# Patient Record
Sex: Female | Born: 1960 | Race: White | Hispanic: No | Marital: Married | Smoking: Current every day smoker
Health system: Southern US, Community
[De-identification: ages and names within clinical notes are randomized; demographics above are authoritative.]

## PROBLEM LIST (undated history)

## (undated) DIAGNOSIS — K219 Gastro-esophageal reflux disease without esophagitis: Secondary | ICD-10-CM

## (undated) DIAGNOSIS — I10 Essential (primary) hypertension: Secondary | ICD-10-CM

## (undated) DIAGNOSIS — M1990 Unspecified osteoarthritis, unspecified site: Secondary | ICD-10-CM

## (undated) DIAGNOSIS — E785 Hyperlipidemia, unspecified: Secondary | ICD-10-CM

## (undated) DIAGNOSIS — IMO0001 Reserved for inherently not codable concepts without codable children: Secondary | ICD-10-CM

## (undated) HISTORY — PX: TUBAL LIGATION: SHX77

## (undated) HISTORY — PX: FRACTURE SURGERY: SHX138

---

## 2012-05-14 ENCOUNTER — Emergency Department (HOSPITAL_COMMUNITY): Payer: Self-pay

## 2012-05-14 ENCOUNTER — Encounter (HOSPITAL_COMMUNITY): Payer: Self-pay | Admitting: *Deleted

## 2012-05-14 ENCOUNTER — Emergency Department (HOSPITAL_COMMUNITY)
Admission: EM | Admit: 2012-05-14 | Discharge: 2012-05-14 | Disposition: A | Discharge status: Home / Self Care | Payer: Self-pay | Attending: Emergency Medicine | Admitting: Emergency Medicine

## 2012-05-14 DIAGNOSIS — T148XXA Other injury of unspecified body region, initial encounter: Secondary | ICD-10-CM

## 2012-05-14 DIAGNOSIS — X58XXXA Exposure to other specified factors, initial encounter: Secondary | ICD-10-CM | POA: Insufficient documentation

## 2012-05-14 DIAGNOSIS — I10 Essential (primary) hypertension: Secondary | ICD-10-CM | POA: Insufficient documentation

## 2012-05-14 DIAGNOSIS — Y93E9 Activity, other interior property and clothing maintenance: Secondary | ICD-10-CM | POA: Insufficient documentation

## 2012-05-14 DIAGNOSIS — Y92009 Unspecified place in unspecified non-institutional (private) residence as the place of occurrence of the external cause: Secondary | ICD-10-CM | POA: Insufficient documentation

## 2012-05-14 DIAGNOSIS — IMO0002 Reserved for concepts with insufficient information to code with codable children: Secondary | ICD-10-CM | POA: Insufficient documentation

## 2012-05-14 DIAGNOSIS — F17200 Nicotine dependence, unspecified, uncomplicated: Secondary | ICD-10-CM | POA: Insufficient documentation

## 2012-05-14 DIAGNOSIS — Y998 Other external cause status: Secondary | ICD-10-CM | POA: Insufficient documentation

## 2012-05-14 HISTORY — DX: Essential (primary) hypertension: I10

## 2012-05-14 MED ORDER — OXYCODONE-ACETAMINOPHEN 5-325 MG PO TABS: 1.0000 | ORAL_TABLET | ORAL | Status: AC | PRN

## 2012-05-14 MED ORDER — OXYCODONE-ACETAMINOPHEN 5-325 MG PO TABS
1.0000 | ORAL_TABLET | Freq: Once | ORAL | Status: AC
  Administered 2012-05-14: 1 via ORAL
  Filled 2012-05-14: qty 1

## 2012-05-14 NOTE — ED Notes (Signed)
Pt alert & oriented x4, stable gait. Patient given discharge instructions, paperwork & prescription(s). Patient  instructed to stop at the registration desk to finish any additional paperwork. Patient verbalized understanding. Pt left department w/ no further questions. 

## 2012-05-14 NOTE — ED Notes (Signed)
Pt states she took a flexeril last night. Has not taken anything today.

## 2012-05-14 NOTE — ED Notes (Signed)
Pt states has mid back pain with some chest pain x 2 days. Denies SOB, N/V and radiating pain. Pt states was cleaning house when pain started  "have have pulled something".

## 2012-05-17 NOTE — ED Provider Notes (Signed)
Medical screening examination/treatment/procedure(s) were performed by non-physician practitioner and as supervising physician I was immediately available for consultation/collaboration.   Glynn Octave, MD 05/17/12 1436

## 2012-05-17 NOTE — ED Provider Notes (Signed)
History     CSN: [id number]  Arrival date & time 05/14/12  1707   First MD Initiated Contact with Patient 05/14/12 1950      Chief Complaint  Patient presents with  . Back Pain  . Chest Pain    (Consider location/radiation/quality/duration/timing/severity/associated sxs/prior treatment) HPI Comments: Brittany Hudson presents with mid back pain which started suddenly while cleaning her house 2 days ago.  She describes left upper back pain which is sharp and radiates to her left axilla,  Is worse with deep inspiration,  Moving her left arm and with palpation along her left upper back and shoulder blade. She denies sob, nausea, vomiting, weakness, fever and cough.  She denies falls or other injury,  Simply was reaching forward when the pain began.  The history is provided by the patient and the spouse.    Past Medical History  Diagnosis Date  . Hypertension     Past Surgical History  Procedure Date  . Tubal ligation   . Fracture surgery     No family history on file.  History  Substance Use Topics  . Smoking status: Current Every Day Smoker -- 1.0 packs/day    Types: Cigarettes  . Smokeless tobacco: Not on file  . Alcohol Use: No    OB History    Grav Para Term Preterm Abortions TAB SAB Ect Mult Living                  Review of Systems  Constitutional: Negative for fever and chills.  Respiratory: Negative for cough and shortness of breath.   Cardiovascular: Negative for chest pain and leg swelling.  Gastrointestinal: Negative for abdominal pain, constipation and abdominal distention.  Genitourinary: Negative for dysuria, urgency, frequency, flank pain and difficulty urinating.  Musculoskeletal: Positive for back pain. Negative for joint swelling and gait problem.  Skin: Negative for rash.  Neurological: Negative for weakness and numbness.    Allergies  Aspirin  Home Medications   Current Outpatient Rx  Name Route Sig Dispense Refill  . ATENOLOL 25 MG PO  TABS Oral Take 25 mg by mouth every evening.     Marland Kitchen OMEGA-3 FATTY ACIDS 1000 MG PO CAPS Oral Take 1 g by mouth daily.     . IBUPROFEN 200 MG PO TABS Oral Take 800 mg by mouth daily as needed. For pain    . OXYCODONE-ACETAMINOPHEN 5-325 MG PO TABS Oral Take 1 tablet by mouth every 4 (four) hours as needed for pain. 20 tablet 0    BP 138/94  Pulse 74  Temp 98.6 F (37 C) (Oral)  Resp 20  Ht 5\' 7"  (1.702 m)  Wt 145 lb (65.772 kg)  BMI 22.71 kg/m2  SpO2 97%  Physical Exam  Nursing note and vitals reviewed. Constitutional: She appears well-developed and well-nourished.  HENT:  Head: Normocephalic.  Eyes: Conjunctivae normal are normal.  Neck: Normal range of motion. Neck supple.  Cardiovascular: Normal rate, regular rhythm and intact distal pulses.        Pedal pulses normal.  Pulmonary/Chest: Effort normal and breath sounds normal. No respiratory distress.  Abdominal: Soft. Bowel sounds are normal. She exhibits no distension and no mass.  Musculoskeletal: Normal range of motion. She exhibits no edema.       Lumbar back: She exhibits tenderness. She exhibits no swelling, no edema and no spasm.       Back:       TTP left upper back along lower edge of scapula to  left mid axillar line.  No thoracic spinous process ttp.  Equal grip strength.  Neurological: She is alert. She has normal strength. She displays no atrophy and no tremor. No sensory deficit. Gait normal.  Reflex Scores:      Patellar reflexes are 2+ on the right side and 2+ on the left side.      Achilles reflexes are 2+ on the right side and 2+ on the left side.      No strength deficit noted in hip and knee flexor and extensor muscle groups.  Ankle flexion and extension intact.  Skin: Skin is warm and dry.  Psychiatric: She has a normal mood and affect.    ED Course  Procedures (including critical care time)  Labs Reviewed - No data to display No results found.   1. Muscle strain       MDM  Findings  consistent with body wall/ muscle strain.  Pt prescribed oxycodone.  Encouraged ice/heat therapy.  She has flexeril at home, encouraged tid use x 5 days.   Patients labs and/or radiological studies were reviewed during the medical decision making and disposition process.         Burgess Amor, PA 05/17/12 1357

## 2013-08-13 IMAGING — CR DG CHEST 2V
2 series · 2 of 2 positions shown · non-contrast
Comparison: None.

CLINICAL DATA: Chest pain

CHEST - 2 VIEW

[view not recorded (1 of 2)]
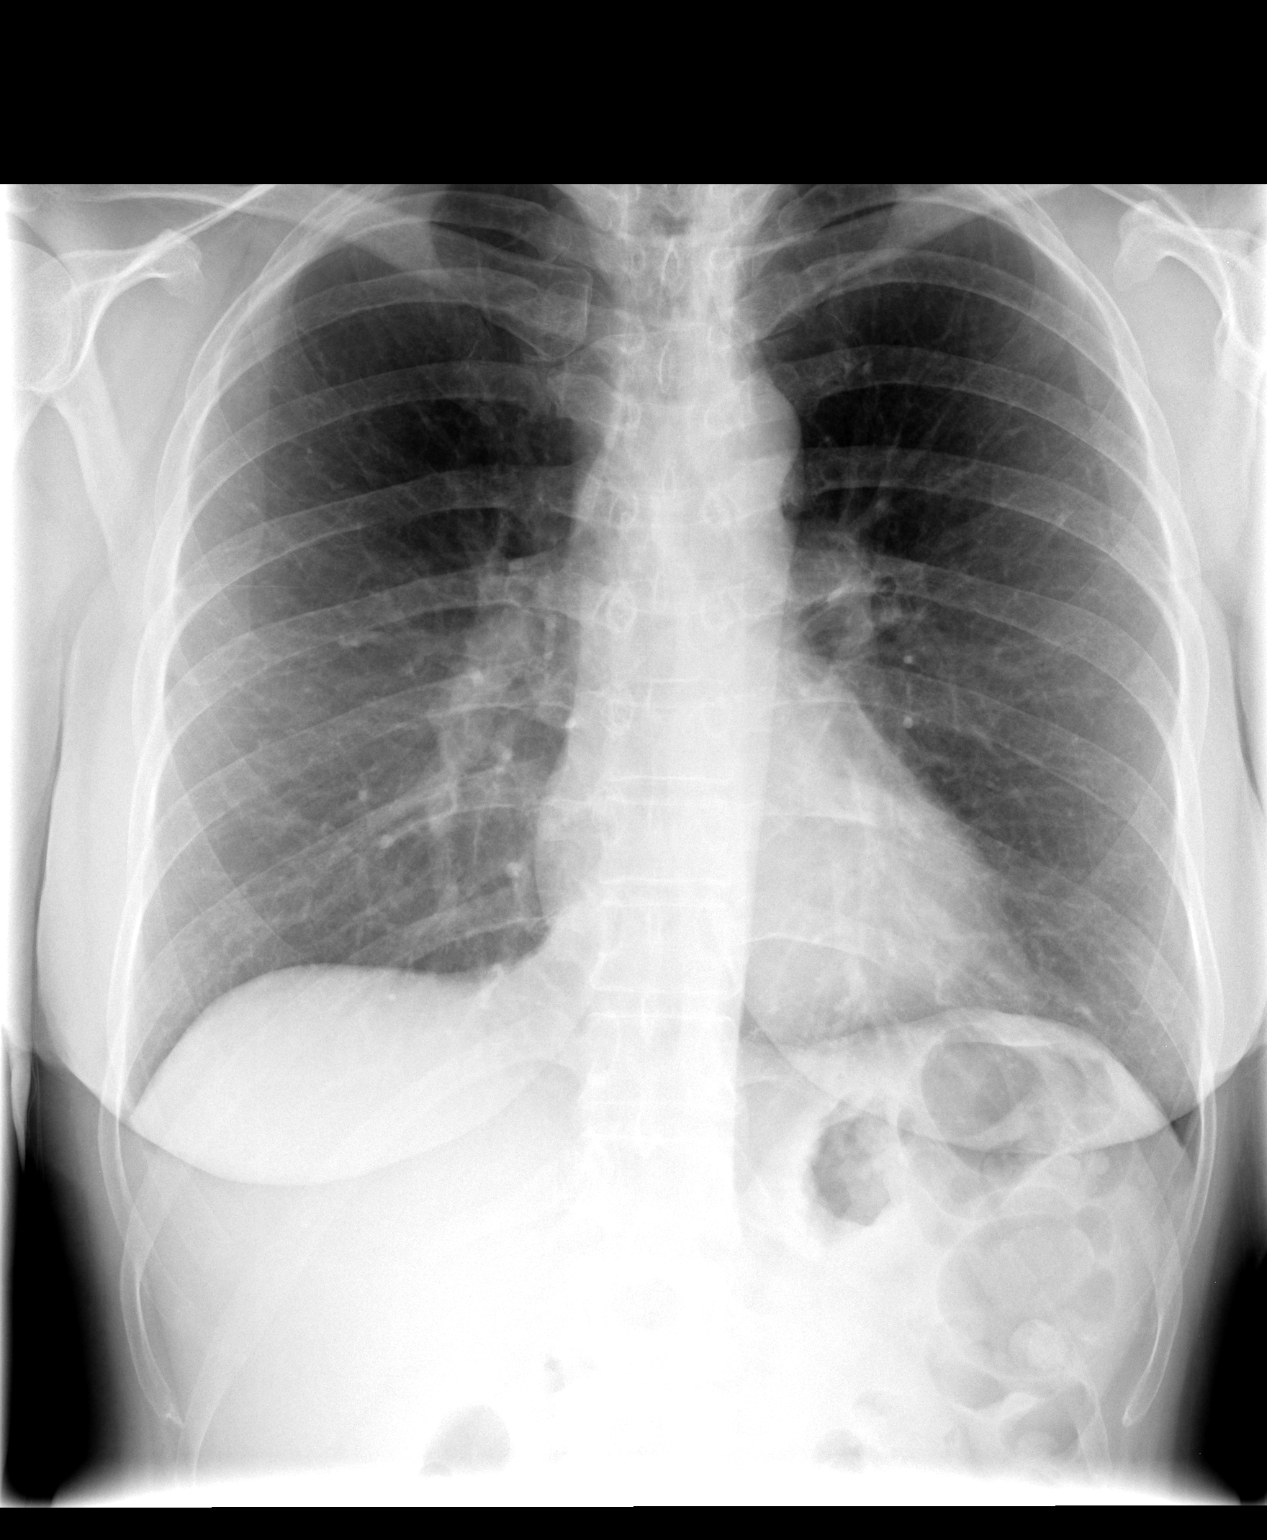

[view not recorded (2 of 2)]
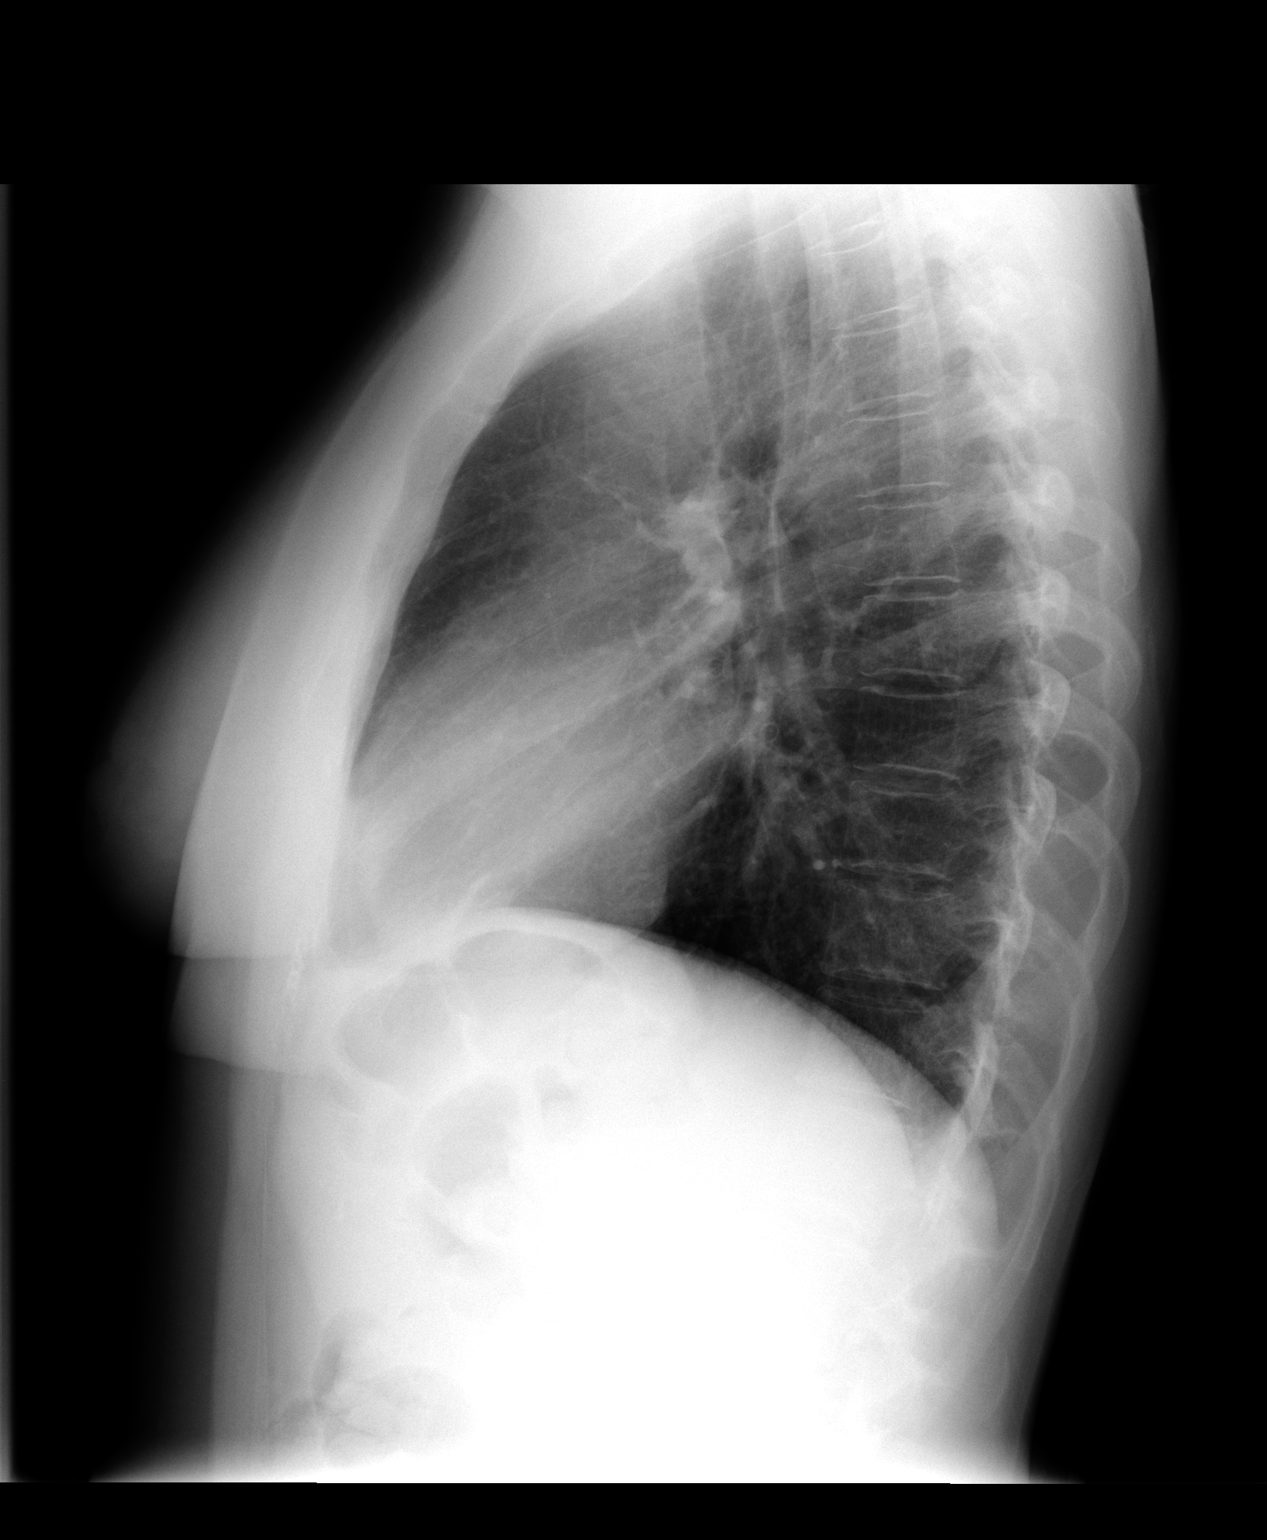

[2 of 2 positions shown; findings below may reference images not displayed]

FINDINGS: The lungs are clear without focal consolidation, edema,
effusion or pneumothorax.  Cardiopericardial silhouette is within
normal limits for size.  Imaged bony structures of the thorax are
intact.
IMPRESSION: Normal exam.

## 2015-10-04 ENCOUNTER — Emergency Department (HOSPITAL_COMMUNITY): Payer: Self-pay

## 2015-10-04 ENCOUNTER — Encounter (HOSPITAL_COMMUNITY): Payer: Self-pay | Admitting: *Deleted

## 2015-10-04 ENCOUNTER — Emergency Department (HOSPITAL_COMMUNITY)
Admission: EM | Admit: 2015-10-04 | Discharge: 2015-10-04 | Disposition: A | Discharge status: Home / Self Care | Payer: Self-pay | Attending: Emergency Medicine | Admitting: Emergency Medicine

## 2015-10-04 DIAGNOSIS — S29011A Strain of muscle and tendon of front wall of thorax, initial encounter: Secondary | ICD-10-CM

## 2015-10-04 DIAGNOSIS — W01198A Fall on same level from slipping, tripping and stumbling with subsequent striking against other object, initial encounter: Secondary | ICD-10-CM | POA: Insufficient documentation

## 2015-10-04 DIAGNOSIS — M1990 Unspecified osteoarthritis, unspecified site: Secondary | ICD-10-CM | POA: Insufficient documentation

## 2015-10-04 DIAGNOSIS — Z8719 Personal history of other diseases of the digestive system: Secondary | ICD-10-CM | POA: Insufficient documentation

## 2015-10-04 DIAGNOSIS — Y998 Other external cause status: Secondary | ICD-10-CM | POA: Insufficient documentation

## 2015-10-04 DIAGNOSIS — Y9289 Other specified places as the place of occurrence of the external cause: Secondary | ICD-10-CM | POA: Insufficient documentation

## 2015-10-04 DIAGNOSIS — E785 Hyperlipidemia, unspecified: Secondary | ICD-10-CM | POA: Insufficient documentation

## 2015-10-04 DIAGNOSIS — F17210 Nicotine dependence, cigarettes, uncomplicated: Secondary | ICD-10-CM | POA: Insufficient documentation

## 2015-10-04 DIAGNOSIS — Y9389 Activity, other specified: Secondary | ICD-10-CM | POA: Insufficient documentation

## 2015-10-04 DIAGNOSIS — Z760 Encounter for issue of repeat prescription: Secondary | ICD-10-CM

## 2015-10-04 DIAGNOSIS — R05 Cough: Secondary | ICD-10-CM | POA: Insufficient documentation

## 2015-10-04 DIAGNOSIS — I10 Essential (primary) hypertension: Secondary | ICD-10-CM

## 2015-10-04 HISTORY — DX: Gastro-esophageal reflux disease without esophagitis: K21.9

## 2015-10-04 HISTORY — DX: Unspecified osteoarthritis, unspecified site: M19.90

## 2015-10-04 HISTORY — DX: Reserved for inherently not codable concepts without codable children: IMO0001

## 2015-10-04 HISTORY — DX: Hyperlipidemia, unspecified: E78.5

## 2015-10-04 MED ORDER — ATENOLOL 25 MG PO TABS: 25.0000 mg | ORAL_TABLET | Freq: Every day | ORAL | Status: AC

## 2015-10-04 MED ORDER — TRAMADOL HCL 50 MG PO TABS: 50.0000 mg | ORAL_TABLET | Freq: Four times a day (QID) | ORAL | Status: AC | PRN

## 2015-10-04 NOTE — ED Notes (Signed)
Left rib pain x 2.5 weeks. Pt states unsure how she injured rib area. States she has been driving a vehicle without power steering and also fell in the woods 2.5 weeks ago. NAD.

## 2015-10-04 NOTE — Discharge Instructions (Signed)
Hypertension Hypertension, commonly called high blood pressure, is when the force of blood pumping through your arteries is too strong. Your arteries are the blood vessels that carry blood from your heart throughout your body. A blood pressure reading consists of a higher number over a lower number, such as 110/72. The higher number (systolic) is the pressure inside your arteries when your heart pumps. The lower number (diastolic) is the pressure inside your arteries when your heart relaxes. Ideally you want your blood pressure below 120/80. Hypertension forces your heart to work harder to pump blood. Your arteries may become narrow or stiff. Having untreated or uncontrolled hypertension can cause heart attack, stroke, kidney disease, and other problems. RISK FACTORS Some risk factors for high blood pressure are controllable. Others are not.  Risk factors you cannot control include:   Race. You may be at higher risk if you are African American.  Age. Risk increases with age.  Gender. Men are at higher risk than women before age 45 years. After age 65, women are at higher risk than men. Risk factors you can control include:  Not getting enough exercise or physical activity.  Being overweight.  Getting too much fat, sugar, calories, or salt in your diet.  Drinking too much alcohol. SIGNS AND SYMPTOMS Hypertension does not usually cause signs or symptoms. Extremely high blood pressure (hypertensive crisis) may cause headache, anxiety, shortness of breath, and nosebleed. DIAGNOSIS To check if you have hypertension, your health care provider will measure your blood pressure while you are seated, with your arm held at the level of your heart. It should be measured at least twice using the same arm. Certain conditions can cause a difference in blood pressure between your right and left arms. A blood pressure reading that is higher than normal on one occasion does not mean that you need treatment. If  it is not clear whether you have high blood pressure, you may be asked to return on a different day to have your blood pressure checked again. Or, you may be asked to monitor your blood pressure at home for 1 or more weeks. TREATMENT Treating high blood pressure includes making lifestyle changes and possibly taking medicine. Living a healthy lifestyle can help lower high blood pressure. You may need to change some of your habits. Lifestyle changes may include:  Following the DASH diet. This diet is high in fruits, vegetables, and whole grains. It is low in salt, red meat, and added sugars.  Keep your sodium intake below 2,300 mg per day.  Getting at least 30-45 minutes of aerobic exercise at least 4 times per week.  Losing weight if necessary.  Not smoking.  Limiting alcoholic beverages.  Learning ways to reduce stress. Your health care provider may prescribe medicine if lifestyle changes are not enough to get your blood pressure under control, and if one of the following is true:  You are 18-59 years of age and your systolic blood pressure is above 140.  You are 60 years of age or older, and your systolic blood pressure is above 150.  Your diastolic blood pressure is above 90.  You have diabetes, and your systolic blood pressure is over 140 or your diastolic blood pressure is over 90.  You have kidney disease and your blood pressure is above 140/90.  You have heart disease and your blood pressure is above 140/90. Your personal target blood pressure may vary depending on your medical conditions, your age, and other factors. HOME CARE INSTRUCTIONS    Have your blood pressure rechecked as directed by your health care provider.   Take medicines only as directed by your health care provider. Follow the directions carefully. Blood pressure medicines must be taken as prescribed. The medicine does not work as well when you skip doses. Skipping doses also puts you at risk for  problems.  Do not smoke.   Monitor your blood pressure at home as directed by your health care provider. SEEK MEDICAL CARE IF:   You think you are having a reaction to medicines taken.  You have recurrent headaches or feel dizzy.  You have swelling in your ankles.  You have trouble with your vision. SEEK IMMEDIATE MEDICAL CARE IF:  You develop a severe headache or confusion.  You have unusual weakness, numbness, or feel faint.  You have severe chest or abdominal pain.  You vomit repeatedly.  You have trouble breathing. MAKE SURE YOU:   Understand these instructions.  Will watch your condition.  Will get help right away if you are not doing well or get worse.   This information is not intended to replace advice given to you by your health care provider. Make sure you discuss any questions you have with your health care provider.   Document Released: 07/31/2005 Document Revised: 12/15/2014 Document Reviewed: 05/23/2013 Elsevier Interactive Patient Education 2016 [employer]  

## 2015-10-04 NOTE — ED Provider Notes (Signed)
CSN: [id number]     Arrival date & time 10/04/15  4098 History   First MD Initiated Contact with Patient 10/04/15 [phone]     Chief Complaint  Patient presents with  . Left rib pain      (Consider location/radiation/quality/duration/timing/severity/associated sxs/prior Treatment) The history is provided by the patient.   Brittany Hudson is a 55 y.o. female with history indicated below presenting with an approximate 2.5 week history of left rib pain.  She endorses tripping and falling (landed on her back) prior to onset of pain.  Her pain is also triggered by driving, stating she has been driving a car without power steering which make the pain worse.  She has noted swelling at the site of pain and is concerned about rib fracture. She denies fevers, chills, shortness of breath although deep inspiration makes pain worse.  She has had no medications prior to arrival.  Discussed elevated bp today, states she lost her insurance and pcp, has been out of her medications for awhile. She denies headache, vision changes, denies peripheral edema and leg pain. She is a smoker and reports a chronic dry cough.     Past Medical History  Diagnosis Date  . Hypertension   . Reflux   . Hyperlipidemia   . Arthritis    Past Surgical History  Procedure Laterality Date  . Tubal ligation    . Fracture surgery     No family history on file. Social History  Substance Use Topics  . Smoking status: Current Every Day Smoker -- 0.50 packs/day    Types: Cigarettes  . Smokeless tobacco: None  . Alcohol Use: No   OB History    No data available     Review of Systems  Constitutional: Negative for fever.  HENT: Negative for congestion and sore throat.   Eyes: Negative.   Respiratory: Positive for cough. Negative for chest tightness and shortness of breath.   Cardiovascular: Positive for chest pain.  Gastrointestinal: Negative for nausea, vomiting and abdominal pain.  Genitourinary: Negative.     Musculoskeletal: Negative for joint swelling, arthralgias and neck pain.  Skin: Negative.  Negative for rash and wound.  Neurological: Negative for dizziness, weakness, light-headedness, numbness and headaches.  Psychiatric/Behavioral: Negative.       Allergies  Aspirin  Home Medications   Prior to Admission medications   Medication Sig Start Date End Date Taking? Authorizing Provider  atenolol (TENORMIN) 25 MG tablet Take 1 tablet (25 mg total) by mouth daily. 10/04/15   Burgess Amor, PA-C  fish oil-omega-3 fatty acids 1000 MG capsule Take 1 g by mouth daily.     Historical Provider, MD  ibuprofen (ADVIL,MOTRIN) 200 MG tablet Take 800 mg by mouth daily as needed. For pain    Historical Provider, MD  traMADol (ULTRAM) 50 MG tablet Take 1 tablet (50 mg total) by mouth every 6 (six) hours as needed for moderate pain. 10/04/15   Burgess Amor, PA-C   BP 144/83 mmHg  Pulse 71  Temp(Src) 98.1 F (36.7 C) (Oral)  Resp 15  Ht  (1.702 m)  Wt 65.772 kg  BMI 22.71 kg/m2  SpO2 100% Physical Exam  Constitutional: She appears well-developed and well-nourished.  HENT:  Head: Normocephalic and atraumatic.  Eyes: Conjunctivae are normal.  Neck: Normal range of motion.  Cardiovascular: Normal rate, regular rhythm, normal heart sounds and intact distal pulses.   Pulmonary/Chest: Effort normal and breath sounds normal. She has no decreased breath sounds. She has no wheezes.  She has no rhonchi. She has no rales. She exhibits tenderness.    Reproducible tenderness left mid ribs at midclavicular line inferior to breast.  There is no deformity or crepitus.  Abdominal: Soft. Bowel sounds are normal. There is no tenderness.  Musculoskeletal: Normal range of motion.  Neurological: She is alert.  Skin: Skin is warm and dry.  Psychiatric: She has a normal mood and affect.  Nursing note and vitals reviewed.   ED Course  Procedures (including critical care time) Labs Review Labs Reviewed - No  data to display  Imaging Review Dg Ribs Unilateral W/chest Left  10/04/2015  CLINICAL DATA:  Left rib pain for the past 2.5 weeks. Fell at that time. EXAM: LEFT RIBS AND CHEST - 3+ VIEW COMPARISON:  05/14/2012 chest radiographs. FINDINGS: Normal sized heart. Interval small amount of linear density at the left lung base. Otherwise, clear lungs. No rib fracture or pneumothorax. IMPRESSION: 1. Interval minimal left basilar linear atelectasis or scarring. 2. No rib fracture seen. Electronically Signed   By: Beckie Salts M.D.   On: 10/04/2015 08:24   I have personally reviewed and evaluated these images and lab results as part of my medical decision-making.   EKG Interpretation   Date/Time:  Monday October 04 2015 08:24:35 EST Ventricular Rate:  70 PR Interval:  146 QRS Duration: 92 QT Interval:  427 QTC Calculation: 461 R Axis:   44 Text Interpretation:  Sinus rhythm No old tracing to compare Confirmed by  RANCOUR  MD, STEPHEN [phone]) on 10/04/2015 8:34:48 AM      MDM   Final diagnoses:  Strain of chest wall, initial encounter  Medication refill  Essential hypertension    Reproducible chest wall pain which is localized after a fall several weeks ago.  Vital signs are normal.  No exam findings suggesting DVT.  Doubt PE.  Suspect patient has been straining this site with her daily activities.  She was prescribed tramadol, also advised heat therapy.  She is given a prescription for her atenolol and advised to obtain primary care, referral given to try to general pediatric medicine.  When necessary follow-up anticipated.    Burgess Amor, PA-C 10/04/15 [id number]  Glynn Octave, MD 10/04/15 1435

## 2015-10-22 ENCOUNTER — Encounter (HOSPITAL_COMMUNITY): Payer: Self-pay | Admitting: Emergency Medicine

## 2015-10-22 ENCOUNTER — Emergency Department (HOSPITAL_COMMUNITY)
Admission: EM | Admit: 2015-10-22 | Discharge: 2015-10-22 | Disposition: A | Discharge status: Home / Self Care | Payer: Self-pay | Attending: Emergency Medicine | Admitting: Emergency Medicine

## 2015-10-22 ENCOUNTER — Emergency Department (HOSPITAL_COMMUNITY): Payer: Self-pay

## 2015-10-22 DIAGNOSIS — I10 Essential (primary) hypertension: Secondary | ICD-10-CM | POA: Insufficient documentation

## 2015-10-22 DIAGNOSIS — F17210 Nicotine dependence, cigarettes, uncomplicated: Secondary | ICD-10-CM | POA: Insufficient documentation

## 2015-10-22 DIAGNOSIS — Y9289 Other specified places as the place of occurrence of the external cause: Secondary | ICD-10-CM | POA: Insufficient documentation

## 2015-10-22 DIAGNOSIS — Z79899 Other long term (current) drug therapy: Secondary | ICD-10-CM | POA: Insufficient documentation

## 2015-10-22 DIAGNOSIS — Y998 Other external cause status: Secondary | ICD-10-CM | POA: Insufficient documentation

## 2015-10-22 DIAGNOSIS — E785 Hyperlipidemia, unspecified: Secondary | ICD-10-CM | POA: Insufficient documentation

## 2015-10-22 DIAGNOSIS — Z8719 Personal history of other diseases of the digestive system: Secondary | ICD-10-CM | POA: Insufficient documentation

## 2015-10-22 DIAGNOSIS — R0789 Other chest pain: Secondary | ICD-10-CM

## 2015-10-22 DIAGNOSIS — R05 Cough: Secondary | ICD-10-CM | POA: Insufficient documentation

## 2015-10-22 DIAGNOSIS — W01198A Fall on same level from slipping, tripping and stumbling with subsequent striking against other object, initial encounter: Secondary | ICD-10-CM | POA: Insufficient documentation

## 2015-10-22 DIAGNOSIS — S29002A Unspecified injury of muscle and tendon of back wall of thorax, initial encounter: Secondary | ICD-10-CM | POA: Insufficient documentation

## 2015-10-22 DIAGNOSIS — M1990 Unspecified osteoarthritis, unspecified site: Secondary | ICD-10-CM | POA: Insufficient documentation

## 2015-10-22 DIAGNOSIS — Y9389 Activity, other specified: Secondary | ICD-10-CM | POA: Insufficient documentation

## 2015-10-22 DIAGNOSIS — S29001A Unspecified injury of muscle and tendon of front wall of thorax, initial encounter: Secondary | ICD-10-CM | POA: Insufficient documentation

## 2015-10-22 LAB — I-STAT CHEM 8, ED
BUN: 11 mg/dL (ref 6–20)
CHLORIDE: 104 mmol/L (ref 101–111)
Calcium, Ion: 1.18 mmol/L (ref 1.12–1.23)
Creatinine, Ser: 0.8 mg/dL (ref 0.44–1.00)
Glucose, Bld: 93 mg/dL (ref 65–99)
HEMATOCRIT: 42 % (ref 36.0–46.0)
HEMOGLOBIN: 14.3 g/dL (ref 12.0–15.0)
POTASSIUM: 4.1 mmol/L (ref 3.5–5.1)
SODIUM: 142 mmol/L (ref 135–145)
TCO2: 25 mmol/L (ref 0–100)

## 2015-10-22 LAB — D-DIMER, QUANTITATIVE (NOT AT ARMC): D DIMER QUANT: 0.28 ug{FEU}/mL (ref 0.00–0.50)

## 2015-10-22 MED ORDER — KETOROLAC TROMETHAMINE 60 MG/2ML IM SOLN
60.0000 mg | Freq: Once | INTRAMUSCULAR | Status: AC
Start: 2015-10-22 — End: 2015-10-22
  Administered 2015-10-22: 60 mg via INTRAMUSCULAR
  Filled 2015-10-22: qty 2

## 2015-10-22 MED ORDER — IBUPROFEN 800 MG PO TABS: 800.0000 mg | ORAL_TABLET | Freq: Three times a day (TID) | ORAL | Status: AC | PRN

## 2015-10-22 MED ORDER — CYCLOBENZAPRINE HCL 10 MG PO TABS: 10.0000 mg | ORAL_TABLET | Freq: Three times a day (TID) | ORAL | Status: AC | PRN

## 2015-10-22 NOTE — ED Notes (Signed)
Chaperoned breast exam with ChadWest, [place]

## 2015-10-22 NOTE — ED Notes (Signed)
Patient states only [facility] symptom [facility] is that her L breast is sore to the touch.

## 2015-10-22 NOTE — ED Provider Notes (Signed)
CSN: [id number]     Arrival date & time 10/22/15  1021 History   First MD Initiated Contact with Patient 10/22/15 1151     Chief Complaint  Patient presents with  . Cough  . Breast Pain  . Rib pain      (Consider location/radiation/quality/duration/timing/severity/associated sxs/prior Treatment) The history is provided by the patient.     Pt with hx HTN, HLD, smoking p/w left sided chest wall pain that has been ongoing x 1 month, now spreading to involve her left back and left shoulder.  Pain is improved with lying still, worse with any kind of movement.  Prior to this she was coughing a lot, green and yellow sputum, now is too painful to cough.  The pain is described as sharp and has been constant since 10/04/15.  Denies fevers, chills, bodyaches, SOB, trauma, heavy lifting, immobilization, leg swelling, exogenous estrogen.  She did fall in the woods onto her back prior to the pain beginning - significant other does not believe it was a bad fall.  Pt also notes she no longer hurts in the left rib where she initially had pain.  Has had mammography in the past that was normal - denies any breast mass, nipple discharge, skin changes, rash.    Past Medical History  Diagnosis Date  . Hypertension   . Reflux   . Hyperlipidemia   . Arthritis    Past Surgical History  Procedure Laterality Date  . Tubal ligation    . Fracture surgery     No family history on file. Social History  Substance Use Topics  . Smoking status: Current Every Day Smoker -- 0.50 packs/day    Types: Cigarettes  . Smokeless tobacco: None  . Alcohol Use: No   OB History    No data available     Review of Systems  All other systems reviewed and are negative.     Allergies  Aspirin  Home Medications   Prior to Admission medications   Medication Sig Start Date End Date Taking? Authorizing Provider  atenolol (TENORMIN) 25 MG tablet Take 1 tablet (25 mg total) by mouth daily. 10/04/15   Burgess AmorJulie Idol, PA-C   fish oil-omega-3 fatty acids 1000 MG capsule Take 1 g by mouth daily.     Historical Provider, MD  ibuprofen (ADVIL,MOTRIN) 200 MG tablet Take 800 mg by mouth daily as needed. For pain    Historical Provider, MD  traMADol (ULTRAM) 50 MG tablet Take 1 tablet (50 mg total) by mouth every 6 (six) hours as needed for moderate pain. 10/04/15   Burgess AmorJulie Idol, PA-C   BP 133/91 mmHg  Pulse 75  Temp(Src) 98 F (36.7 C) (Oral)  Resp 18  Ht 5' 7.5" (1.715 m)  Wt 63.05 kg  BMI 21.44 kg/m2  SpO2 98% Physical Exam  Constitutional: She appears well-developed and well-nourished. No distress.  HENT:  Head: Normocephalic and atraumatic.  Neck: Neck supple.  Cardiovascular: Normal rate and regular rhythm.   Pulmonary/Chest: Effort normal and breath sounds normal. No respiratory distress. She has no wheezes. She has no rales. She exhibits tenderness (Diffuse left chest wall tenderness without skin changes ). Right breast exhibits no inverted nipple, no mass, no nipple discharge, no skin change and no tenderness. Left breast exhibits no inverted nipple, no mass, no nipple discharge and no skin change. Breasts are symmetrical.  Abdominal: Soft. She exhibits no distension. There is no tenderness. There is no rebound and no guarding.  Musculoskeletal: She exhibits  no edema.  Neurological: She is alert. She exhibits normal muscle tone.  Skin: She is not diaphoretic.  Psychiatric: She has a normal mood and affect. Her behavior is normal.  Nursing note and vitals reviewed.   ED Course  Procedures (including critical care time) Labs Review Labs Reviewed  D-DIMER, QUANTITATIVE (NOT AT [facility])  I-STAT CHEM 8, ED    Imaging Review Dg Chest 2 View  10/22/2015  CLINICAL DATA:  Cough and left chest pain for 1 month. EXAM: CHEST  2 VIEW COMPARISON:  10/04/2015 and 05/14/2012 radiographs FINDINGS: The cardiomediastinal silhouette is unremarkable. There is no evidence of focal airspace disease, pulmonary edema,  suspicious pulmonary nodule/mass, pleural effusion, or pneumothorax. No acute bony abnormalities are identified. IMPRESSION: No active cardiopulmonary disease. Electronically Signed   By: Harmon Pier M.D.   On: 10/22/2015 13:47   I have personally reviewed and evaluated these images and lab results as part of my medical decision-making.   EKG Interpretation None      ED ECG REPORT   Date: 10/22/2015  Rate: 79  Rhythm: normal sinus rhythm  QRS Axis: normal  Intervals: normal  ST/T Wave abnormalities: normal  Conduction Disutrbances:none  Narrative Interpretation: baseline shaky  Old EKG Reviewed: none available   MDM   Final diagnoses:  Chest wall pain   Afebrile nontoxic patient with left sided chest wall pain x 1 month.  Is a smoker with prior cough.  Pain is reproduced with palpation. Workup unremarkable.  D/C with pain medication, PCP follow up.  Discussed result, findings, treatment, and follow up  with patient.  Pt given return precautions.  Pt verbalizes understanding and agrees with plan.       Trixie Dredge, PA-C 10/22/15 1710  Cathren Laine, MD 10/25/15 [phone]

## 2015-10-22 NOTE — Discharge Instructions (Signed)
Read the information below.  Use the prescribed medication as directed.  Please discuss all new medications with your pharmacist.  You may return to the Emergency Department at any time for worsening condition or any new symptoms that concern you.    If you develop worsening chest pain, shortness of breath, fever, you pass out, or become weak or dizzy, return to the ER for a recheck.   Chest Wall Pain Chest wall pain is pain in or around the bones and muscles of your chest. Sometimes, an injury causes this pain. Sometimes, the cause may not be known. This pain may take several weeks or longer to get better. HOME CARE INSTRUCTIONS  Pay attention to any changes in your symptoms. Take these actions to help with your pain:   Rest as told by your health care provider.   Avoid activities that cause pain. These include any activities that use your chest muscles or your abdominal and side muscles to lift heavy items.   If directed, apply ice to the painful area:  Put ice in a plastic bag.  Place a towel between your skin and the bag.  Leave the ice on for 20 minutes, 2-3 times per day.  Take over-the-counter and prescription medicines only as told by your health care provider.  Do not use tobacco products, including cigarettes, chewing tobacco, and e-cigarettes. If you need help quitting, ask your health care provider.  Keep all follow-up visits as told by your health care provider. This is important. SEEK MEDICAL CARE IF:  You have a fever.  Your chest pain becomes worse.  You have new symptoms. SEEK IMMEDIATE MEDICAL CARE IF:  You have nausea or vomiting.  You feel sweaty or light-headed.  You have a cough with phlegm (sputum) or you cough up blood.  You develop shortness of breath.   This information is not intended to replace advice given to you by your health care provider. Make sure you discuss any questions you have with your health care provider.   Document Released:  07/31/2005 Document Revised: 04/21/2015 Document Reviewed: 10/26/2014 Elsevier Interactive Patient Education 2016 [employer]. [employer] Assistance The United Ways 211 is a great source of information about community services available.  Access by dialing 2-1-1 from anywhere in [place], or by website -  PooledIncome.pl.   Other Local Resources (Updated 08/2015)  Financial Assistance   Services    Phone Number and Address  [facility]  Low-cost medical care - 1st and 3rd Saturday of every month  Must not qualify for public or private insurance and must have limited income [phone] [address] [place], [place]    [place] [employer] of Social Services  Child care  Emergency assistance for housing and [employer]  Medicaid [phone] [address] [place], [place] [address]   [facility]  Low-cost medical care for children, communicable diseases, sexually-transmitted diseases, immunizations, maternity care, womens health and family planning [phone] [address] [place], [place] [address]  [facility]   Medication assistance for [facility] residents  Must meet income requirements [phone] [address] [place], [place].    [facility] Social Services  Child care  Emergency assistance for housing and [employer]  Medicaid [phone] [address] [place], [place] [address]  [facility]   Low-cost medical care,   Monday through Friday, 9 am to 6 pm.  Accepts Medicare/Medicaid, and self-pay [phone] [address] [place], [place] [address]  [facility]  Low-cost medical care - Monday through Friday, 8:30 am - 5:30 pm  Accepts Medicaid and self-pay [phone] [address], [address] [place], [place] [address]    [facility]  Primary medical care, including for those with sickle cell disease  Accepts Medicare, Medicaid, insurance and self-pay [phone] [address] [place], [place]  [facility]   Primary medical care  Accepts Medicare, [place], insurance and self-pay [phone] [address] [place], [place] [address]   [facility] of Social Services  Child care  Emergency assistance for housing and [employer]ly[employer]arkutilities  Food stamps  Medicaid [phone] [address] [place], [place] [address]  [facility] of Health and [employer] Services  Child care  Emergency assistance for housing and Kimberly[employer]arkutilities  Food stamps  Medicaid [phone] [address] [place], [place] [address]   [facility]  Medication assistance for [facility] residents with no insurance only  Must have a primary care doctor [phone] [address], [address] [place], [place]  [facility]   Primary medical care  [place]s Medicare, [place], insurance  [phone] [address] [place], [place]  [facility]   Medication assistance [phone]  [facility]   Primary medical care  Accepts Medicare, [place], insurance and self-pay [phone] [place], [place] [address]  [facility]   Primary medical care  Accepts Medicare, [place], insurance and self-pay [phone] [address] [place], [place] [address]  [facility]  For [place] residents between the ages of 1618 and 2364 who do not have any form of health insurance, Medicare, [place]icaid, or [place] benefits.  Services are provided free of charge to uninsured patients who fall within federal poverty guidelines.    Hours: Tuesdays and Thursdays, 4:15 - 8 pm [phone] [address], [address] [place], [place] [address]   [facility]     Primary medical care  Dental care  Nutritional counseling  Pharmacy  Accepts Medicaid, Medicare, most insurance.  Fees are adjusted based on ability to pay.   [phone] [facility] [address] [place], [place]  [phone] [facility]les [facility] [address] [place], [place]  [phone] [facility] [place], [place]  [phone] [facility], [address] [place], [place]  [phone] [facility] [address] [place], [place]  [facility]  Womens health and family planning [phone] [address] [place], [place]  [facility] of Social Services  Child care  Emergency assistance for housing and Kimberly-Clarkutilities  Food stamps  Medicaid [phone] [address], [place], [place] [address]   [facility]    Ages 8418 and older  Hours: Mondays and Thursdays, 7:00 am - 9:00 am Patients are seen on a first come, first served basis. [phone], [phone]. [phone] [place], [place]  [facility] Social Services  Child care  Emergency assistance for housing and Kimberly-Clarkutilities  Food stamps  Medicaid [phone] [address] [place], [place] [address]  [employer]  Medication assistance  Rental assistance  Food pantry  Medication assistance  Housing assistance  Emergency food distribution  Utility assistance [phone] [address] [place], [place]  [phone]  [address] [place],  [place] [address] Hours: Tuesdays and Thursdays from 9am - 12 noon by appointment only  [phone] [address] [place], [place] [address]  [facility] - Lanae Boast   Accepts private insurance, [place], and [place].  Payment is based on a sliding scale for those without insurance.  Hours: Mondays, Tuesdays  and Thursdays, 8:30 am - 5:30 pm.   [phone] [address] [place], [place]  [facility] - Family Medicine at [facility], [place], and [place].  Payment is based on a sliding scale for those without insurance. [phone] [address] [place], [place]  [facility] - Pediatrics at E. Scientist, research (physical sciences), [employer], and [place].  Payment is based on a sliding scale for those without insurance [phone] [address], [employer], [place]  [facility] - Pediatrics at [employer], [place], and [place].  Payment is based on a sliding scale for those without insurance. [phone] [address] [place], [place]  [facility] - Pediatrics at [facility], [place], and [place].  Payment is based on a sliding scale for those without insurance. [phone], [phone] [place], [place].    [facility]  Maternity care.  Accepts Medicaid and self-pay. [phone] [address] [place], [place]

## 2015-10-22 NOTE — ED Notes (Signed)
Pt c/o cough, left breast pain and left rib pain x 1 month. [facility] and was told she had bruised ribs.

## 2016-06-01 ENCOUNTER — Emergency Department (HOSPITAL_COMMUNITY)
Admission: EM | Admit: 2016-06-01 | Discharge: 2016-06-01 | Disposition: A | Discharge status: Home / Self Care | Payer: Self-pay | Attending: Emergency Medicine | Admitting: Emergency Medicine

## 2016-06-01 ENCOUNTER — Encounter (HOSPITAL_COMMUNITY): Payer: Self-pay | Admitting: Emergency Medicine

## 2016-06-01 DIAGNOSIS — Z792 Long term (current) use of antibiotics: Secondary | ICD-10-CM | POA: Insufficient documentation

## 2016-06-01 DIAGNOSIS — K029 Dental caries, unspecified: Secondary | ICD-10-CM

## 2016-06-01 DIAGNOSIS — F17210 Nicotine dependence, cigarettes, uncomplicated: Secondary | ICD-10-CM | POA: Insufficient documentation

## 2016-06-01 DIAGNOSIS — Z79899 Other long term (current) drug therapy: Secondary | ICD-10-CM | POA: Insufficient documentation

## 2016-06-01 DIAGNOSIS — I10 Essential (primary) hypertension: Secondary | ICD-10-CM | POA: Insufficient documentation

## 2016-06-01 MED ORDER — AMOXICILLIN 500 MG PO CAPS: 500.0000 mg | ORAL_CAPSULE | Freq: Three times a day (TID) | ORAL | 0 refills | Status: AC

## 2016-06-01 MED ORDER — OXYCODONE-ACETAMINOPHEN 5-325 MG PO TABS: 1.0000 | ORAL_TABLET | ORAL | 0 refills | Status: AC | PRN

## 2016-06-01 MED ORDER — AMOXICILLIN 250 MG PO CAPS
500.0000 mg | ORAL_CAPSULE | Freq: Once | ORAL | Status: AC
  Administered 2016-06-01: 500 mg via ORAL
  Filled 2016-06-01: qty 2

## 2016-06-01 MED ORDER — OXYCODONE-ACETAMINOPHEN 5-325 MG PO TABS
2.0000 | ORAL_TABLET | Freq: Once | ORAL | Status: AC
  Administered 2016-06-01: 2 via ORAL
  Filled 2016-06-01: qty 2

## 2016-06-01 NOTE — Discharge Instructions (Signed)
Can take 4 ibuprofen 3 times a day, prescription for antibiotic and pain medicine, you must see a dentist. Phone number given.

## 2016-06-01 NOTE — ED Triage Notes (Signed)
Pt c/o left dental pain since last night.

## 2016-06-01 NOTE — ED Provider Notes (Signed)
[facility] DEPT Provider Note   CSN: [id number] Arrival date & time: 06/01/16  1933     History   Chief Complaint Chief Complaint  Patient presents with  . Dental Pain    HPI Brittany Hudson is a 55 y.o. female.  Left lower dental pain for 24 hours. Patient has a rotten tooth in this area. No fever, sweats, chills, meningeal signs.      Past Medical History:  Diagnosis Date  . Arthritis   . Hyperlipidemia   . Hypertension   . Reflux     There are no active problems to display for this patient.   Past Surgical History:  Procedure Laterality Date  . FRACTURE SURGERY    . TUBAL LIGATION      OB History    No data available       Home Medications    Prior to Admission medications   Medication Sig Start Date End Date Taking? Authorizing Provider  amoxicillin (AMOXIL) 500 MG capsule Take 1 capsule (500 mg total) by mouth 3 (three) times daily. 06/01/16   Brittany Hutching, MD  atenolol (TENORMIN) 25 MG tablet Take 1 tablet (25 mg total) by mouth daily. 10/04/15   Burgess Amor, PA-C  cyclobenzaprine (FLEXERIL) 10 MG tablet Take 1 tablet (10 mg total) by mouth 3 (three) times daily as needed for muscle spasms (or pain). 10/22/15   Trixie Dredge, PA-C  fish oil-omega-3 fatty acids 1000 MG capsule Take 1 g by mouth daily.     Historical Provider, MD  ibuprofen (ADVIL,MOTRIN) 800 MG tablet Take 1 tablet (800 mg total) by mouth every 8 (eight) hours as needed for mild pain or moderate pain. 10/22/15   Trixie Dredge, PA-C  oxyCODONE-acetaminophen (PERCOCET) 5-325 MG tablet Take 1-2 tablets by mouth every 4 (four) hours as needed. 06/01/16   Brittany Hutching, MD  traMADol (ULTRAM) 50 MG tablet Take 1 tablet (50 mg total) by mouth every 6 (six) hours as needed for moderate pain. 10/04/15   Burgess Amor, PA-C    Family History No family history on file.  Social History Social History  Substance Use Topics  . Smoking status: Current Every Day Smoker    Packs/day: 0.50    Types: Cigarettes    . Smokeless tobacco: Never Used  . Alcohol use No     Allergies   Aspirin   Review of Systems Review of Systems  All other systems reviewed and are negative.    Physical Exam Updated Vital Signs BP 143/92 (BP Location: Left Arm)   Pulse 84   Temp 98 F (36.7 C) (Oral)   Resp 18   Ht 5' 7.5" (1.715 m)   Wt 150 lb (68 kg)   SpO2 99%   BMI 23.15 kg/m   Physical Exam  Constitutional: She is oriented to person, place, and time. She appears well-developed and well-nourished.  HENT:  Head: Normocephalic and atraumatic.  Tooth #19 fracture at the base with obvious caries. Tender gingiva  Eyes: Conjunctivae are normal.  Neck: Neck supple.  Musculoskeletal: Normal range of motion.  Neurological: She is alert and oriented to person, place, and time.  Skin: Skin is warm and dry.  Psychiatric: She has a normal mood and affect. Her behavior is normal.  Nursing note and vitals reviewed.    ED Treatments / Results  Labs (all labs ordered are listed, but only abnormal results are displayed) Labs Reviewed - No data to display  EKG  EKG Interpretation None  Radiology No results found.  Procedures Procedures (including critical care time)  Medications Ordered in ED Medications  amoxicillin (AMOXIL) capsule 500 mg (500 mg Oral Given 06/01/16 2000)  oxyCODONE-acetaminophen (PERCOCET/ROXICET) 5-325 MG per tablet 2 tablet (2 tablets Oral Given 06/01/16 2000)     Initial Impression / Assessment and Plan / ED Course  I have reviewed the triage vital signs and the nursing notes.  Pertinent labs & imaging results that were available during my care of the patient were reviewed by me and considered in my medical decision making (see chart for details).  Clinical Course    Rx amoxicillin, Percocet, ibuprofen for dental pain. Referral to Dentist  Final Clinical Impressions(s) / ED Diagnoses   Final diagnoses:  Pain due to dental caries    New  Prescriptions New Prescriptions   AMOXICILLIN (AMOXIL) 500 MG CAPSULE    Take 1 capsule (500 mg total) by mouth 3 (three) times daily.   OXYCODONE-ACETAMINOPHEN (PERCOCET) 5-325 MG TABLET    Take 1-2 tablets by mouth every 4 (four) hours as needed.     Brittany HutchingBrian Jlynn Ly, MD 06/01/16 2004
